# Patient Record
Sex: Male | Born: 1955 | Race: White | Hispanic: No | Marital: Married | Smoking: Never smoker
Health system: Southern US, Community
[De-identification: ages and names within clinical notes are randomized; demographics above are authoritative.]

## PROBLEM LIST (undated history)

## (undated) DIAGNOSIS — E785 Hyperlipidemia, unspecified: Secondary | ICD-10-CM

## (undated) DIAGNOSIS — I10 Essential (primary) hypertension: Secondary | ICD-10-CM

## (undated) DIAGNOSIS — M549 Dorsalgia, unspecified: Secondary | ICD-10-CM

## (undated) HISTORY — PX: APPENDECTOMY: SHX54

## (undated) HISTORY — PX: HYDROCELE EXCISION: SHX482

---

## 2017-03-05 ENCOUNTER — Emergency Department (HOSPITAL_BASED_OUTPATIENT_CLINIC_OR_DEPARTMENT_OTHER): Payer: BC Managed Care – PPO

## 2017-03-05 ENCOUNTER — Emergency Department (HOSPITAL_BASED_OUTPATIENT_CLINIC_OR_DEPARTMENT_OTHER)
Admission: EM | Admit: 2017-03-05 | Discharge: 2017-03-05 | Disposition: A | Discharge status: Home / Self Care | Payer: BC Managed Care – PPO | Attending: Emergency Medicine | Admitting: Emergency Medicine

## 2017-03-05 ENCOUNTER — Encounter (HOSPITAL_BASED_OUTPATIENT_CLINIC_OR_DEPARTMENT_OTHER): Payer: Self-pay | Admitting: Emergency Medicine

## 2017-03-05 DIAGNOSIS — Y929 Unspecified place or not applicable: Secondary | ICD-10-CM | POA: Diagnosis not present

## 2017-03-05 DIAGNOSIS — W312XXA Contact with powered woodworking and forming machines, initial encounter: Secondary | ICD-10-CM | POA: Insufficient documentation

## 2017-03-05 DIAGNOSIS — Z79899 Other long term (current) drug therapy: Secondary | ICD-10-CM | POA: Diagnosis not present

## 2017-03-05 DIAGNOSIS — S61209A Unspecified open wound of unspecified finger without damage to nail, initial encounter: Secondary | ICD-10-CM

## 2017-03-05 DIAGNOSIS — I10 Essential (primary) hypertension: Secondary | ICD-10-CM | POA: Diagnosis not present

## 2017-03-05 DIAGNOSIS — S6991XA Unspecified injury of right wrist, hand and finger(s), initial encounter: Secondary | ICD-10-CM | POA: Diagnosis present

## 2017-03-05 DIAGNOSIS — S61011A Laceration without foreign body of right thumb without damage to nail, initial encounter: Secondary | ICD-10-CM | POA: Diagnosis not present

## 2017-03-05 DIAGNOSIS — Y999 Unspecified external cause status: Secondary | ICD-10-CM | POA: Insufficient documentation

## 2017-03-05 DIAGNOSIS — Y939 Activity, unspecified: Secondary | ICD-10-CM | POA: Diagnosis not present

## 2017-03-05 HISTORY — DX: Hyperlipidemia, unspecified: E78.5

## 2017-03-05 HISTORY — DX: Dorsalgia, unspecified: M54.9

## 2017-03-05 HISTORY — DX: Essential (primary) hypertension: I10

## 2017-03-05 MED ORDER — IBUPROFEN 800 MG PO TABS: 800.0000 mg | ORAL_TABLET | Freq: Three times a day (TID) | ORAL | 0 refills | Status: AC | PRN

## 2017-03-05 MED ORDER — TRAMADOL HCL 50 MG PO TABS: 50.0000 mg | ORAL_TABLET | Freq: Four times a day (QID) | ORAL | 0 refills | Status: AC | PRN

## 2017-03-05 MED ORDER — LIDOCAINE HCL 2 % IJ SOLN
20.0000 mL | Freq: Once | INTRAMUSCULAR | Status: AC
  Administered 2017-03-05: 400 mg
  Filled 2017-03-05: qty 20

## 2017-03-05 NOTE — Discharge Instructions (Signed)
Return here as needed.  Follow-up with Dr. provided

## 2017-03-05 NOTE — ED Triage Notes (Signed)
Pt c/o lac to RT thumb from table saw today

## 2017-03-05 NOTE — ED Notes (Addendum)
Pt has laceration on right thumb from using table saw today. Pt rinsed thumb and used antibiotic ointment PTA.  Bleeding is controlled. Pt denies being on blood thinners.

## 2017-03-06 NOTE — ED Provider Notes (Signed)
[facility]-EMERGENCY DEPT Provider Note   CSN: [id number] Arrival date & time: 03/05/17  1239     History   Chief Complaint Chief Complaint  Patient presents with  . Laceration    HPI Glenn Floyd is a 61 y.o. male.  HPI Patient presents to the emergency department with a laceration to the right thumb following a table saw injury.  The patient states that he was pushing a board the table saw on the board seemed to kick and his finger caught and skimmed across the edge of the blade.  Patient states that he controlled the bleeding with direct pressure.  Patient denies any other injuries Past Medical History:  Diagnosis Date  . Back pain   . Hyperlipidemia   . Hypertension     There are no active problems to display for this patient.   Past Surgical History:  Procedure Laterality Date  . APPENDECTOMY    . HYDROCELE EXCISION         Home Medications    Prior to Admission medications   Medication Sig Start Date End Date Taking? Authorizing Provider  HYDROCODONE-ACETAMINOPHEN PO Take by mouth.   Yes [provider]  LISINOPRIL PO Take by mouth.   Yes [provider]  LOVASTATIN PO Take by mouth.   Yes [provider]  ibuprofen (ADVIL,MOTRIN) 800 MG tablet Take 1 tablet (800 mg total) by mouth every 8 (eight) hours as needed. 03/05/17   Zabdiel Dripps, Cristal Deer, PA-C  traMADol (ULTRAM) 50 MG tablet Take 1 tablet (50 mg total) by mouth every 6 (six) hours as needed for severe pain. 03/05/17   Charlestine Night, PA-C    Family History No family history on file.  Social History Social History  Substance Use Topics  . Smoking status: Never Smoker  . Smokeless tobacco: Never Used  . Alcohol use 2.4 oz/week    4 Cans of beer per week     Comment: daily     Allergies   Penicillins   Review of Systems Review of Systems All other systems negative except as documented in the HPI. All pertinent positives and negatives as reviewed in the  HPI.  Physical Exam Updated Vital Signs BP (!) 152/89   Pulse 73   Temp 98.6 F (37 C) (Oral)   Resp 20   Ht 5\' 9"  (1.753 m)   Wt 83.9 kg (185 lb)   SpO2 100%   BMI 27.32 kg/m   Physical Exam  Constitutional: He is oriented to person, place, and time. He appears well-developed and well-nourished. No distress.  HENT:  Head: Normocephalic and atraumatic.  Eyes: Pupils are equal, round, and reactive to light.  Pulmonary/Chest: Effort normal.  Musculoskeletal:       Hands: Neurological: He is alert and oriented to person, place, and time.  Skin: Skin is warm and dry.  Psychiatric: He has a normal mood and affect.  Nursing note and vitals reviewed.    ED Treatments / Results  Labs (all labs ordered are listed, but only abnormal results are displayed) Labs Reviewed - No data to display  EKG  EKG Interpretation None       Radiology Dg Finger Thumb Right  Result Date: 03/05/2017 CLINICAL DATA:  Recent saw injury with soft tissue defect, initial encounter EXAM: RIGHT THUMB 2+V COMPARISON:  None. FINDINGS: Soft tissue defect is noted along the distal aspect of the first digit. No underlying bony abnormality is seen. IMPRESSION: Soft tissue defect without bony abnormality. Electronically Signed  By: Alcide Clever M.D.   On: 03/05/2017 14:15    Procedures Procedures (including critical care time)  Medications Ordered in ED Medications  lidocaine (XYLOCAINE) 2 % (with pres) injection 400 mg (400 mg Infiltration Given by Other 03/05/17 1558)     Initial Impression / Assessment and Plan / ED Course  I have reviewed the triage vital signs and the nursing notes.  Pertinent labs & imaging results that were available during my care of the patient were reviewed by me and considered in my medical decision making (see chart for details).     LACERATION REPAIR Performed by: Carlyle Dolly Authorized by: Carlyle Dolly Consent: Verbal consent obtained. Risks and  benefits: risks, benefits and alternatives were discussed Consent given by: patient Patient identity confirmed: provided demographic data Prepped and Draped in normal sterile fashion Wound explored  Laceration Location: Right thumb distal portion  Laceration Length: 2cm  No Foreign Bodies seen or palpated  Anesthesia: local infiltration  Local anesthetic: lidocaine 2 % without epinephrine  Anesthetic total: 6 ml  Irrigation method: syringe Amount of cleaning: standard  Skin closure: 5-0 Prolene   Number of sutures: 3   Technique: Simple interrupted   Patient tolerance: Patient tolerated the procedure well with no immediate complications. Performed a digital block on the patient.  The wound with skin avulsion is was debrided of any jagged edges of skin.  I did place sutures in the portion of the laceration that has intact skin on both edges of the wound.  I did refer the patient to l plastic surgery because he may need further care of the skin avulsion.  There is no bony injury noted on x-rays  Final Clinical Impressions(s) / ED Diagnoses   Final diagnoses:  Laceration of right thumb without foreign body without damage to nail, initial encounter  Avulsion of skin of finger, initial encounter    New Prescriptions Discharge Medication List as of 03/05/2017  4:42 PM    START taking these medications   Details  ibuprofen (ADVIL,MOTRIN) 800 MG tablet Take 1 tablet (800 mg total) by mouth every 8 (eight) hours as needed., Starting Sun 03/05/2017, Print    traMADol (ULTRAM) 50 MG tablet Take 1 tablet (50 mg total) by mouth every 6 (six) hours as needed for severe pain., Starting Sun 03/05/2017, Print         [place], [place], PA-C 03/06/17 1653    Alvira Monday, MD 03/06/17 [phone]

## 2018-02-15 IMAGING — DX DG FINGER THUMB 2+V*R*
3 series · 3 of 3 positions shown · non-contrast
Comparison: None.

CLINICAL DATA: Recent saw injury with soft tissue defect, initial
encounter

EXAM:
RIGHT THUMB 2+V

[finger ap]
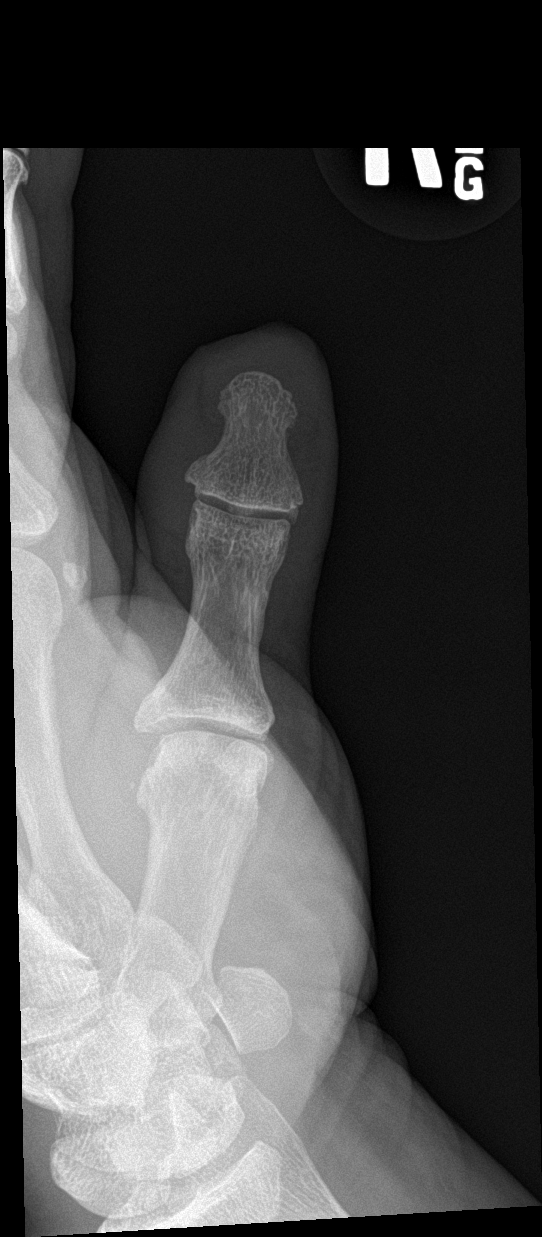

[finger obl]
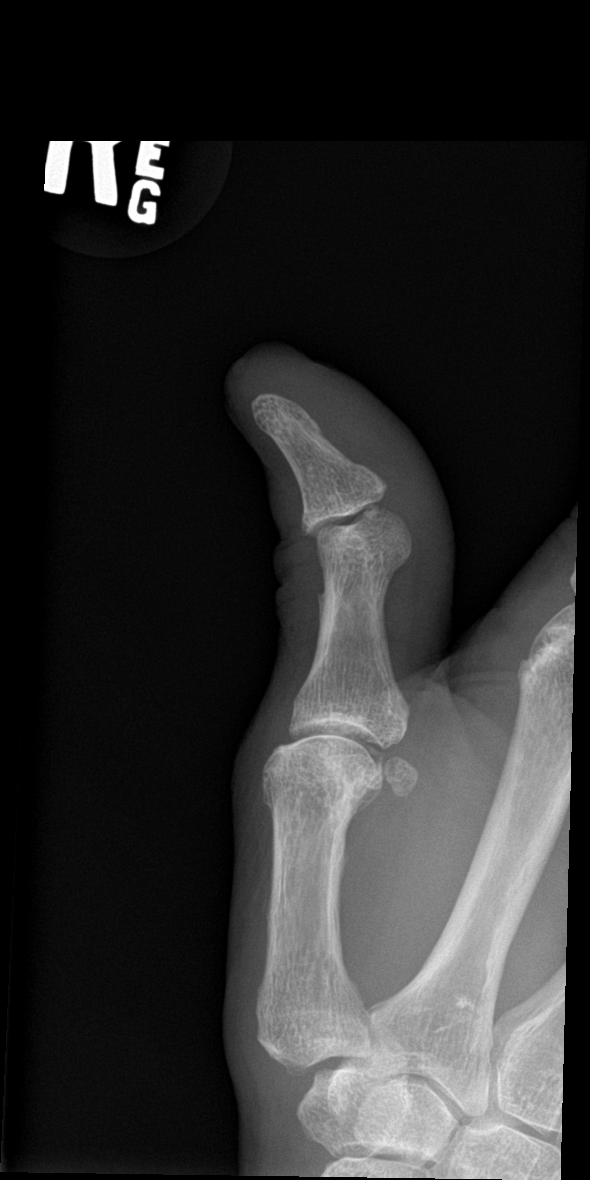

[finger lat]
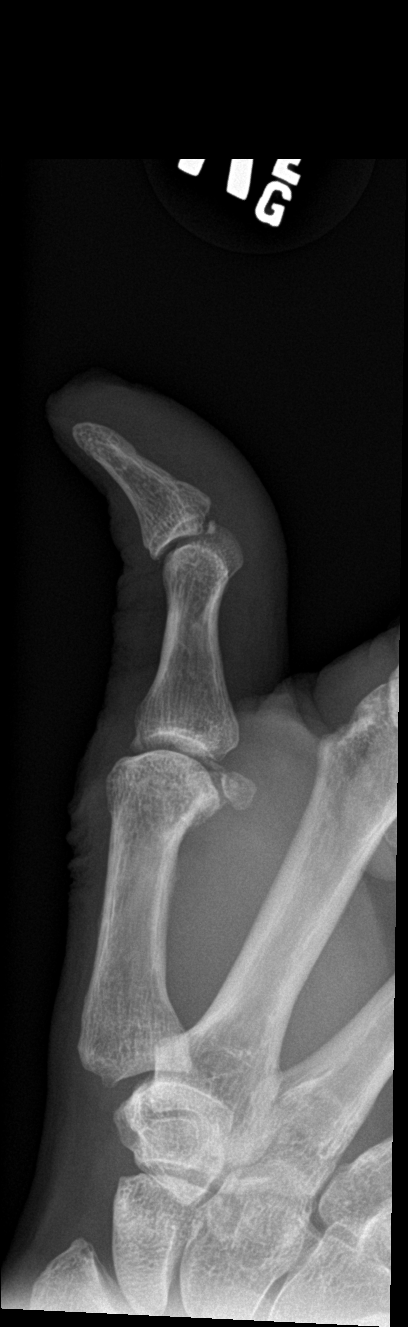

[3 of 3 positions shown; findings below may reference images not displayed]

FINDINGS: Soft tissue defect is noted along the distal aspect of the first
digit. No underlying bony abnormality is seen.
IMPRESSION: Soft tissue defect without bony abnormality.

## 2022-10-13 ENCOUNTER — Encounter (HOSPITAL_BASED_OUTPATIENT_CLINIC_OR_DEPARTMENT_OTHER): Payer: Self-pay | Admitting: *Deleted

## 2022-10-13 ENCOUNTER — Other Ambulatory Visit: Payer: Self-pay

## 2022-10-13 DIAGNOSIS — S01411A Laceration without foreign body of right cheek and temporomandibular area, initial encounter: Secondary | ICD-10-CM | POA: Insufficient documentation

## 2022-10-13 DIAGNOSIS — Z23 Encounter for immunization: Secondary | ICD-10-CM | POA: Diagnosis not present

## 2022-10-13 DIAGNOSIS — W540XXA Bitten by dog, initial encounter: Secondary | ICD-10-CM | POA: Insufficient documentation

## 2022-10-13 DIAGNOSIS — S0993XA Unspecified injury of face, initial encounter: Secondary | ICD-10-CM | POA: Diagnosis present

## 2022-10-13 DIAGNOSIS — S0181XA Laceration without foreign body of other part of head, initial encounter: Secondary | ICD-10-CM | POA: Insufficient documentation

## 2022-10-13 MED ORDER — IBUPROFEN 400 MG PO TABS
400.0000 mg | ORAL_TABLET | Freq: Once | ORAL | Status: AC | PRN
  Administered 2022-10-13: 400 mg via ORAL
  Filled 2022-10-13: qty 1

## 2022-10-13 MED ORDER — OXYCODONE-ACETAMINOPHEN 5-325 MG PO TABS
1.0000 | ORAL_TABLET | ORAL | Status: AC | PRN
  Administered 2022-10-13 – 2022-10-14 (×2): 1 via ORAL
  Filled 2022-10-13 (×2): qty 1

## 2022-10-13 NOTE — ED Notes (Signed)
Deep bite wound to right side of face flushed with 50cc NS and applied gauze dressing

## 2022-10-13 NOTE — ED Triage Notes (Signed)
Pt was bitten by his fathers dog in the fathers dog.  Dog is up to date on rabies vaccine this spring.  Pt has about 4cm bite on the right side of his face and a puncture wound above it.  Pt last tetanus unknown.

## 2022-10-14 ENCOUNTER — Emergency Department (HOSPITAL_BASED_OUTPATIENT_CLINIC_OR_DEPARTMENT_OTHER)
Admission: EM | Admit: 2022-10-14 | Discharge: 2022-10-14 | Disposition: A | Discharge status: Home / Self Care | Payer: Medicare HMO | Attending: Emergency Medicine | Admitting: Emergency Medicine

## 2022-10-14 ENCOUNTER — Emergency Department (HOSPITAL_BASED_OUTPATIENT_CLINIC_OR_DEPARTMENT_OTHER): Payer: Medicare HMO

## 2022-10-14 DIAGNOSIS — W540XXA Bitten by dog, initial encounter: Secondary | ICD-10-CM

## 2022-10-14 MED ORDER — BACITRACIN ZINC 500 UNIT/GM EX OINT
TOPICAL_OINTMENT | Freq: Two times a day (BID) | CUTANEOUS | Status: DC
  Administered 2022-10-14: 1 via TOPICAL

## 2022-10-14 MED ORDER — CLINDAMYCIN HCL 300 MG PO CAPS: 300.0000 mg | ORAL_CAPSULE | Freq: Three times a day (TID) | ORAL | 0 refills | Status: AC

## 2022-10-14 MED ORDER — BACITRACIN ZINC 500 UNIT/GM EX OINT: 1.0000 | TOPICAL_OINTMENT | Freq: Two times a day (BID) | CUTANEOUS | 0 refills | Status: AC

## 2022-10-14 MED ORDER — TETANUS-DIPHTH-ACELL PERTUSSIS 5-2.5-18.5 LF-MCG/0.5 IM SUSY
0.5000 mL | PREFILLED_SYRINGE | Freq: Once | INTRAMUSCULAR | Status: AC
  Administered 2022-10-14: 0.5 mL via INTRAMUSCULAR
  Filled 2022-10-14: qty 0.5

## 2022-10-14 MED ORDER — CLINDAMYCIN HCL 150 MG PO CAPS
300.0000 mg | ORAL_CAPSULE | Freq: Once | ORAL | Status: AC
  Administered 2022-10-14: 300 mg via ORAL
  Filled 2022-10-14: qty 2

## 2022-10-14 MED ORDER — LIDOCAINE-EPINEPHRINE (PF) 2 %-1:200000 IJ SOLN
20.0000 mL | Freq: Once | INTRAMUSCULAR | Status: AC
  Administered 2022-10-14: 20 mL
  Filled 2022-10-14: qty 20

## 2022-10-14 NOTE — ED Provider Notes (Signed)
[facility] Provider Note   CSN: [id number] Arrival date & time: 10/13/22  2044     History  Chief Complaint  Patient presents with   Animal Bite    Glenn Floyd is a 67 y.o. male.  Patient sustained dog bite to right face from his own pitbull.  It is his deceased father-in-law's dog.  Dog is up-to-date on his vaccinations.  Patient was trying to move the dog from outside to inside and believes the dog was injured.  He was struck to the right face sustaining laceration to right cheek and chin.  No other injury.  No loss of consciousness.  No blood thinner use.  No focal weakness, numbness or tingling.  Tetanus needs to be updated.  Injury occurred around 8 PM.  The history is provided by the patient.  Animal Bite Associated symptoms: no fever        Home Medications Prior to Admission medications   Medication Sig Start Date End Date Taking? Authorizing Provider  HYDROCODONE-ACETAMINOPHEN PO Take by mouth.    [provider]  ibuprofen (ADVIL,MOTRIN) 800 MG tablet Take 1 tablet (800 mg total) by mouth every 8 (eight) hours as needed. 03/05/17   Lawyer, Cristal Deer, PA-C  LISINOPRIL PO Take by mouth.    [provider]  LOVASTATIN PO Take by mouth.    [provider]  traMADol (ULTRAM) 50 MG tablet Take 1 tablet (50 mg total) by mouth every 6 (six) hours as needed for severe pain. 03/05/17   Lawyer, Cristal Deer, PA-C      Allergies    Penicillins    Review of Systems   Review of Systems  Constitutional:  Negative for activity change, appetite change and fever.  HENT:  Negative for congestion.   Respiratory:  Negative for cough, chest tightness, shortness of breath and stridor.   Cardiovascular:  Negative for chest pain.  Gastrointestinal:  Negative for abdominal pain, diarrhea and vomiting.  Genitourinary:  Negative for dysuria and hematuria.  Musculoskeletal:  Negative for arthralgias and  myalgias.  Skin:  Positive for wound.  Neurological:  Negative for weakness and headaches.   all other systems are negative except as noted in the HPI and PMH.     Physical Exam Updated Vital Signs BP 130/78   Pulse 64   Temp 98.1 F (36.7 C) (Oral)   Resp 18   SpO2 97%  Physical Exam Vitals and nursing note reviewed.  Constitutional:      General: He is not in acute distress.    Appearance: He is well-developed.  HENT:     Head: Normocephalic.     Comments: Deep linear lacerations to right cheek and chin as depicted.  Do not invade oral cavity.  No facial droop or smile asymmetry. Puncture wound to right cheek.    Mouth/Throat:     Pharynx: No oropharyngeal exudate.  Eyes:     Conjunctiva/sclera: Conjunctivae normal.     Pupils: Pupils are equal, round, and reactive to light.  Neck:     Comments: No meningismus. Cardiovascular:     Rate and Rhythm: Normal rate and regular rhythm.     Heart sounds: Normal heart sounds. No murmur heard. Pulmonary:     Effort: Pulmonary effort is normal. No respiratory distress.     Breath sounds: Normal breath sounds.  Abdominal:     Palpations: Abdomen is soft.     Tenderness: There is no abdominal tenderness. There is no guarding  or rebound.  Musculoskeletal:        General: No tenderness. Normal range of motion.     Cervical back: Normal range of motion and neck supple.  Skin:    General: Skin is warm.  Neurological:     Mental Status: He is alert and oriented to person, place, and time.     Cranial Nerves: No cranial nerve deficit.     Motor: No abnormal muscle tone.     Coordination: Coordination normal.     Comments:  5/5 strength throughout. CN 2-12 intact.Equal grip strength.   Psychiatric:        Behavior: Behavior normal.        ED Results / Procedures / Treatments   Labs (all labs ordered are listed, but only abnormal results are displayed) Labs Reviewed - No data to display  EKG None  Radiology CT  Maxillofacial Wo Contrast  Result Date: 10/14/2022 CLINICAL DATA:  Facial trauma. EXAM: CT MAXILLOFACIAL WITHOUT CONTRAST TECHNIQUE: Multidetector CT imaging of the maxillofacial structures was performed. Multiplanar CT image reconstructions were also generated. RADIATION DOSE REDUCTION: This exam was performed according to the departmental dose-optimization program which includes automated exposure control, adjustment of the mA and/or kV according to patient size and/or use of iterative reconstruction technique. COMPARISON:  None Available. FINDINGS: Osseous: No acute fracture or mandibular subluxation. Orbits: The globes and retro-orbital fat are preserved. Sinuses: Mild mucoperiosteal thickening of paranasal sinuses. The left LV. The mastoid air cells are clear. Soft tissues: Negative. Limited intracranial: No significant or unexpected finding. IMPRESSION: 1. No acute facial bone fracture. 2. Mild paranasal sinus disease. Electronically Signed   By: Elgie Collard M.D.   On: 10/14/2022 02:41    Procedures .Marland KitchenLaceration Repair  Date/Time: 10/14/2022 2:13 AM  Performed by: Glynn Octave, MD Authorized by: Glynn Octave, MD   Consent:    Consent obtained:  Verbal   Consent given by:  Patient   Risks, benefits, and alternatives were discussed: yes     Risks discussed:  Infection, nerve damage, need for additional repair, pain, retained foreign body, vascular damage, tendon damage, poor cosmetic result and poor wound healing   Alternatives discussed:  No treatment Universal protocol:    Procedure explained and questions answered to patient or proxy's satisfaction: yes     Patient identity confirmed:  Verbally with patient Anesthesia:    Anesthesia method:  Local infiltration   Local anesthetic:  Lidocaine 2% WITH epi Laceration details:    Location:  Face   Face location:  R cheek   Length (cm):  5 Pre-procedure details:    Preparation:  Patient was prepped and draped in usual sterile  fashion Exploration:    Hemostasis achieved with:  Direct pressure and epinephrine   Imaging outcome: foreign body not noted     Wound exploration: wound explored through full range of motion and entire depth of wound visualized     Wound extent: no foreign body   Treatment:    Area cleansed with:  Povidone-iodine   Amount of cleaning:  Extensive   Irrigation solution:  Sterile saline   Irrigation volume:  2000   Irrigation method:  Syringe   Visualized foreign bodies/material removed: no     Debridement:  Moderate Skin repair:    Repair method:  Sutures   Suture size:  5-0   Suture material:  Prolene   Suture technique:  Simple interrupted   Number of sutures:  8 Approximation:    Approximation:  Loose  Repair type:    Repair type:  Complex Post-procedure details:    Dressing:  Antibiotic ointment   Procedure completion:  Tolerated well, no immediate complications .Marland Kitchen.Laceration Repair  Date/Time: 10/14/2022 2:14 AM  Performed by: Glynn Octaveancour, Renea Schoonmaker, MD Authorized by: Glynn Octaveancour, Ingrid Shifrin, MD   Consent:    Consent obtained:  Verbal   Consent given by:  Patient   Risks, benefits, and alternatives were discussed: yes     Risks discussed:  Infection, need for additional repair, nerve damage, poor wound healing, poor cosmetic result, pain, retained foreign body, tendon damage and vascular damage   Alternatives discussed:  No treatment Universal protocol:    Procedure explained and questions answered to patient or proxy's satisfaction: yes     Patient identity confirmed:  Verbally with patient Anesthesia:    Anesthesia method:  Local infiltration   Local anesthetic:  Lidocaine 2% WITH epi Laceration details:    Location:  Face   Face location:  Chin   Length (cm):  4 Pre-procedure details:    Preparation:  Patient was prepped and draped in usual sterile fashion Exploration:    Hemostasis achieved with:  Direct pressure and epinephrine   Imaging obtained: bedside ultrasound      Imaging outcome: foreign body not noted     Wound exploration: wound explored through full range of motion and entire depth of wound visualized     Wound extent: no underlying fracture and no vascular damage     Contaminated: no   Treatment:    Area cleansed with:  Povidone-iodine   Amount of cleaning:  Standard   Irrigation solution:  Sterile saline   Irrigation volume:  2000   Irrigation method:  Pressure wash   Visualized foreign bodies/material removed: no     Debridement:  Minimal   Scar revision: no   Skin repair:    Repair method:  Sutures   Suture size:  5-0   Suture material:  Prolene   Suture technique:  Simple interrupted   Number of sutures:  6 Approximation:    Approximation:  Loose Repair type:    Repair type:  Complex Post-procedure details:    Dressing:  Antibiotic ointment   Procedure completion:  Tolerated well, no immediate complications     Medications Ordered in ED Medications  oxyCODONE-acetaminophen (PERCOCET/ROXICET) 5-325 MG per tablet 1 tablet (1 tablet Oral Given 10/13/22 2110)  Tdap (BOOSTRIX) injection 0.5 mL (has no administration in time range)  clindamycin (CLEOCIN) capsule 300 mg (has no administration in time range)  lidocaine-EPINEPHrine (XYLOCAINE W/EPI) 2 %-1:200000 (PF) injection 20 mL (has no administration in time range)  ibuprofen (ADVIL) tablet 400 mg (400 mg Oral Given 10/13/22 2110)    ED Course/ Medical Decision Making/ A&P                             Medical Decision Making Amount and/or Complexity of Data Reviewed Labs: ordered. Decision-making details documented in ED Course. Radiology: ordered and independent interpretation performed. Decision-making details documented in ED Course. ECG/medicine tests: ordered and independent interpretation performed. Decision-making details documented in ED Course.  Risk OTC drugs. Prescription drug management.  Bite to right cheek.  Hemostatic.  Vital stable, no distress.  Will clean,  irrigate, update tetanus.  Will need prophylactic antibiotics.  Discussed with patient he will have a scar no matter what.  Discussed this wound is high risk for infection.  Will discuss with ENT preferred repair method.  Discussed with Dr. Elijah Birkaldwell of facial trauma.  Recommends closure as with any other laceration with cleaning and prophylactic antibiotics.  CT scan is negative for fracture.  Results reviewed and interpreted by me.  Lacerations repaired as above.  Wound care instructions given and prophylactic antibiotics.  Patient is penicillin allergic.  Clindamycin alternative given.  Discussed with pharmacy.  Tetanus updated.  Keep wound clean and dry for 24 hours.  Start with gentle soap and water tomorrow.  Suture movable in 5 to 7 days.  Return precautions discussed.        Final Clinical Impression(s) / ED Diagnoses Final diagnoses:  Dog bite of face, initial encounter    Rx / DC Orders ED Discharge Orders     None         Tiffany Talarico, Jeannett SeniorStephen, MD 10/14/22 [phone]

## 2022-10-14 NOTE — ED Notes (Signed)
Reviewed AVS with patient, patient expressed understanding of directions, denies further questions at this time. 

## 2022-10-14 NOTE — Discharge Instructions (Signed)
Keep wound clean and dry for 24 hours.  You may use gentle soap and water starting tomorrow.  Take antibiotics as prescribed.  Follow-up for suture removal in 5 to 7 days.  Return to the ED sooner with worsening pain, redness, swelling, drainage, fever or any other concerns.
